# Patient Record
Sex: Female | Born: 2000 | Race: White | Hispanic: Yes | Marital: Single | Smoking: Never smoker
Health system: Southern US, Community
[De-identification: ages and names within clinical notes are randomized; demographics above are authoritative.]

---
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
## 2023-03-26 ENCOUNTER — Ambulatory Visit
Admission: EM | Admit: 2023-03-26 | Discharge: 2023-03-26 | Disposition: A | Discharge status: Home / Self Care | Payer: Medicaid Other | Attending: Nurse Practitioner | Admitting: Nurse Practitioner

## 2023-03-26 ENCOUNTER — Other Ambulatory Visit: Payer: Self-pay

## 2023-03-26 DIAGNOSIS — S90424A Blister (nonthermal), right lesser toe(s), initial encounter: Secondary | ICD-10-CM

## 2023-03-26 DIAGNOSIS — L089 Local infection of the skin and subcutaneous tissue, unspecified: Secondary | ICD-10-CM

## 2023-03-26 MED ORDER — CEPHALEXIN 500 MG PO CAPS: 500.0000 mg | ORAL_CAPSULE | Freq: Three times a day (TID) | ORAL | 0 refills | Status: AC

## 2023-03-26 MED ORDER — MUPIROCIN 2 % EX OINT: 1.0000 | TOPICAL_OINTMENT | Freq: Two times a day (BID) | CUTANEOUS | 0 refills | Status: AC

## 2023-03-26 NOTE — ED Triage Notes (Signed)
Pt c/o right small toe pain, there is a sore on the bottom of the toe, that started out as a blister, x 2 weeks she popped the blister and it would return pt tried using neosporin on it, blister then, started oozing fluid and turned red and caused pain that is localized to the pinky toe.

## 2023-03-26 NOTE — Discharge Instructions (Addendum)
Take medication as prescribed. May take over-the-counter Tylenol or ibuprofen as needed for pain, fever, or general discomfort. Recommend refraining from running for the next 7 days or while symptoms persist. Make sure you are wearing shoes with good insole and support. While you are home leave the area open to air to allow it to heal.  If you have pets in the home, do not allow them to lick or come into close contact with the affected area. If you experience worsening symptoms to include fever, chills, or if the area of the toe becomes open, you have increased swelling in the toe, or other concerns, please go to the emergency department immediately for further evaluation. Follow-up with your primary care physician within the next 2 days for reevaluation. Follow-up as needed.

## 2023-03-26 NOTE — ED Provider Notes (Signed)
[facility]    CSN: [id number] Arrival date & time: 03/26/23  1712      History   Chief Complaint No chief complaint on file.   HPI Kayla Ashley is a 22 y.o. female.   The history is provided by the patient.   Patient presents for complaints of pain to the right fifth toe that is been present for the past 2 weeks.  Patient states the area started as a blister.  She states she "popped" the area, and now she has a wound to that toe.  She states that the area is painful with walking.  She states that the area also began to "ooze fluid".  She denies fever, chills, chest pain, abdominal pain, nausea, vomiting, or diarrhea.  Patient states that symptoms started as she is an avid runner, and runs almost every day.  She states that she did purchase new shoes since her symptoms started.  History reviewed. No pertinent past medical history.  There are no problems to display for this patient.   History reviewed. No pertinent surgical history.  OB History   No obstetric history on file.      Home Medications    Prior to Admission medications   Medication Sig Start Date End Date Taking? Authorizing Provider  cephALEXin (KEFLEX) 500 MG capsule Take 1 capsule (500 mg total) by mouth 3 (three) times daily for 5 days. 03/26/23 03/31/23 Yes Brand Siever-Warren, Sadie Haber, NP  mupirocin ointment (BACTROBAN) 2 % Apply 1 Application topically 2 (two) times daily. 03/26/23  Yes Keyia Moretto-Warren, Sadie Haber, NP  ondansetron (ZOFRAN) 4 MG tablet Take 1 tablet (4 mg total) by mouth every 8 (eight) hours as needed for nausea or vomiting. 09/16/18   Mesner, Barbara Cower, MD    Family History History reviewed. No pertinent family history.  Social History Social History   Tobacco Use   Smoking status: Never   Smokeless tobacco: Never  Substance Use Topics   Alcohol use: No   Drug use: No     Allergies   Peanut-containing drug products   Review of Systems Review of Systems Per  HPI  Physical Exam Triage Vital Signs ED Triage Vitals  Encounter Vitals Group     BP 03/26/23 1807 108/65     Systolic BP Percentile --      Diastolic BP Percentile --      Pulse Rate 03/26/23 1807 82     Resp 03/26/23 1807 15     Temp 03/26/23 1807 98.3 F (36.8 C)     Temp Source 03/26/23 1806 Oral     SpO2 03/26/23 1807 97 %     Weight --      Height --      Head Circumference --      Peak Flow --      Pain Score 03/26/23 1811 8     Pain Loc --      Pain Education --      Exclude from Growth Chart --    No data found.  Updated Vital Signs BP 108/65 (BP Location: Right Arm)   Pulse 82   Temp 98.3 F (36.8 C) (Oral)   Resp 15   LMP 03/12/2023 (Within Days)   SpO2 97%   Visual Acuity Right Eye Distance:   Left Eye Distance:   Bilateral Distance:    Right Eye Near:   Left Eye Near:    Bilateral Near:     Physical Exam Vitals and nursing note reviewed.  Constitutional:      General: She is not in acute distress.    Appearance: Normal appearance.  HENT:     Head: Normocephalic.  Eyes:     Extraocular Movements: Extraocular movements intact.     Pupils: Pupils are equal, round, and reactive to light.  Pulmonary:     Effort: Pulmonary effort is normal.  Skin:    General: Skin is warm and dry.     Findings: Wound present.     Comments: Wound noted to the bottom of the right fifth toe.  Areas of appears as "dried" blood.  Wound is closed.  There is no oozing, fluctuance, or drainage present.  Area is tender to palpation.  There is no swelling, or redness of the right fourth toe.  Neurological:     General: No focal deficit present.     Mental Status: She is alert and oriented to person, place, and time.  Psychiatric:        Mood and Affect: Mood normal.        Behavior: Behavior normal.      UC Treatments / Results  Labs (all labs ordered are listed, but only abnormal results are displayed) Labs Reviewed - No data to display  EKG   Radiology No  results found.  Procedures Procedures (including critical care time)  Medications Ordered in UC Medications - No data to display  Initial Impression / Assessment and Plan / UC Course  I have reviewed the triage vital signs and the nursing notes.  Pertinent labs & imaging results that were available during my care of the patient were reviewed by me and considered in my medical decision making (see chart for details).  The patient is well-appearing, she is in no acute distress, vital signs are stable.  Patient with wound on the fifth toe of the right foot.  Will treat empirically with Keflex 500 mg and mupirocin ointment 2%.  Supportive care recommendations were provided and discussed with the patient to include over-the-counter analgesics, wearing comfortable shoes, and refraining from running for the next several days.  Patient was given strict ER follow-up precautions.  It is recommended that the patient follow-up with her primary care physician for reevaluation patient with a 7 to 10 days before she would return to exercise.  Patient is in agreement with this plan of care and verbalizes understanding.  All questions were answered.  Patient stable for discharge.  Final Clinical Impressions(s) / UC Diagnoses   Final diagnoses:  None     Discharge Instructions      Take medication as prescribed. May take over-the-counter Tylenol or ibuprofen as needed for pain, fever, or general discomfort. Recommend refraining from running for the next 7 days or while symptoms persist. Make sure you are wearing shoes with good insole and support. While you are home leave the area open to air to allow it to heal.  If you have pets in the home, do not allow them to lick or come into close contact with the affected area. If you experience worsening symptoms to include fever, chills, or if the area of the toe becomes open, you have increased swelling in the toe, or other concerns, please go to the  emergency department immediately for further evaluation. Follow-up with your primary care physician within the next 2 days for reevaluation. Follow-up as needed.      ED Prescriptions     Medication Sig Dispense Auth. Provider   cephALEXin (KEFLEX) 500 MG capsule Take  1 capsule (500 mg total) by mouth 3 (three) times daily for 5 days. 15 capsule Muaaz Brau-Warren, Sadie Haber, NP   mupirocin ointment (BACTROBAN) 2 % Apply 1 Application topically 2 (two) times daily. 22 g Lashuna Tamashiro-Warren, Sadie Haber, NP      PDMP not reviewed this encounter.   Abran Cantor, NP 03/27/23 [phone]

## 2024-06-29 ENCOUNTER — Encounter (HOSPITAL_COMMUNITY): Payer: Self-pay

## 2024-06-29 ENCOUNTER — Emergency Department (HOSPITAL_COMMUNITY): Payer: Self-pay

## 2024-06-29 ENCOUNTER — Other Ambulatory Visit: Payer: Self-pay

## 2024-06-29 ENCOUNTER — Emergency Department (HOSPITAL_COMMUNITY)
Admission: EM | Admit: 2024-06-29 | Discharge: 2024-06-30 | Disposition: A | Discharge status: Home / Self Care | Payer: Self-pay | Attending: Emergency Medicine | Admitting: Emergency Medicine

## 2024-06-29 DIAGNOSIS — Y9355 Activity, bike riding: Secondary | ICD-10-CM | POA: Insufficient documentation

## 2024-06-29 DIAGNOSIS — S0081XA Abrasion of other part of head, initial encounter: Secondary | ICD-10-CM | POA: Diagnosis not present

## 2024-06-29 DIAGNOSIS — S81012A Laceration without foreign body, left knee, initial encounter: Secondary | ICD-10-CM | POA: Diagnosis not present

## 2024-06-29 DIAGNOSIS — Z91010 Allergy to peanuts: Secondary | ICD-10-CM | POA: Insufficient documentation

## 2024-06-29 DIAGNOSIS — S52501A Unspecified fracture of the lower end of right radius, initial encounter for closed fracture: Secondary | ICD-10-CM | POA: Diagnosis not present

## 2024-06-29 DIAGNOSIS — S0990XA Unspecified injury of head, initial encounter: Secondary | ICD-10-CM

## 2024-06-29 DIAGNOSIS — M25531 Pain in right wrist: Secondary | ICD-10-CM | POA: Diagnosis present

## 2024-06-29 DIAGNOSIS — S0003XA Contusion of scalp, initial encounter: Secondary | ICD-10-CM | POA: Diagnosis not present

## 2024-06-29 LAB — BASIC METABOLIC PANEL WITH GFR
Anion gap: 13 (ref 5–15)
BUN: 6 mg/dL (ref 6–20)
CO2: 18 mmol/L — ABNORMAL LOW (ref 22–32)
Calcium: 8.6 mg/dL — ABNORMAL LOW (ref 8.9–10.3)
Chloride: 107 mmol/L (ref 98–111)
Creatinine, Ser: 0.49 mg/dL (ref 0.44–1.00)
GFR, Estimated: >60 mL/min
Glucose, Bld: 90 mg/dL (ref 70–99)
Potassium: 3.6 mmol/L (ref 3.5–5.1)
Sodium: 139 mmol/L (ref 135–145)

## 2024-06-29 LAB — HCG, SERUM, QUALITATIVE: Preg, Serum: NEGATIVE

## 2024-06-29 LAB — CBC WITH DIFFERENTIAL/PLATELET
Abs Immature Granulocytes: 0.07 K/uL (ref 0.00–0.07)
Basophils Absolute: 0.1 K/uL (ref 0.0–0.1)
Basophils Relative: 0 %
Eosinophils Absolute: 0 K/uL (ref 0.0–0.5)
Eosinophils Relative: 0 %
HCT: 38.2 % (ref 36.0–46.0)
Hemoglobin: 12.8 g/dL (ref 12.0–15.0)
Immature Granulocytes: 0 %
Lymphocytes Relative: 14 %
Lymphs Abs: 2.3 K/uL (ref 0.7–4.0)
MCH: 29 pg (ref 26.0–34.0)
MCHC: 33.5 g/dL (ref 30.0–36.0)
MCV: 86.4 fL (ref 80.0–100.0)
Monocytes Absolute: 0.8 K/uL (ref 0.1–1.0)
Monocytes Relative: 5 %
Neutro Abs: 12.6 K/uL — ABNORMAL HIGH (ref 1.7–7.7)
Neutrophils Relative %: 81 %
Platelets: 269 K/uL (ref 150–400)
RBC: 4.42 MIL/uL (ref 3.87–5.11)
RDW: 11.9 % (ref 11.5–15.5)
WBC: 15.9 K/uL — ABNORMAL HIGH (ref 4.0–10.5)
nRBC: 0 % (ref 0.0–0.2)

## 2024-06-29 LAB — URINALYSIS, ROUTINE W REFLEX MICROSCOPIC
Bilirubin Urine: NEGATIVE
Glucose, UA: NEGATIVE mg/dL
Hgb urine dipstick: NEGATIVE
Ketones, ur: NEGATIVE mg/dL
Nitrite: NEGATIVE
Protein, ur: NEGATIVE mg/dL
Specific Gravity, Urine: >1.046 — ABNORMAL HIGH (ref 1.005–1.030)
pH: 7 (ref 5.0–8.0)

## 2024-06-29 MED ORDER — IOHEXOL 300 MG/ML  SOLN
100.0000 mL | Freq: Once | INTRAMUSCULAR | Status: AC | PRN
  Administered 2024-06-29: 100 mL via INTRAVENOUS

## 2024-06-29 MED ORDER — HYDROCODONE-ACETAMINOPHEN 5-325 MG PO TABS: 1.0000 | ORAL_TABLET | ORAL | 0 refills | Status: AC | PRN

## 2024-06-29 MED ORDER — ONDANSETRON HCL 4 MG/2ML IJ SOLN
4.0000 mg | Freq: Once | INTRAMUSCULAR | Status: AC
  Administered 2024-06-29: 4 mg via INTRAVENOUS
  Filled 2024-06-29: qty 2

## 2024-06-29 MED ORDER — BACITRACIN ZINC 500 UNIT/GM EX OINT
TOPICAL_OINTMENT | CUTANEOUS | Status: AC
  Administered 2024-06-29: 1
  Filled 2024-06-29: qty 0.9

## 2024-06-29 MED ORDER — HYDROMORPHONE HCL 1 MG/ML IJ SOLN
1.0000 mg | Freq: Once | INTRAMUSCULAR | Status: AC
  Administered 2024-06-29: 1 mg via INTRAVENOUS
  Filled 2024-06-29: qty 1

## 2024-06-29 MED ORDER — HYDROCODONE-ACETAMINOPHEN 5-325 MG PO TABS
1.0000 | ORAL_TABLET | Freq: Once | ORAL | Status: AC
  Administered 2024-06-29: 1 via ORAL
  Filled 2024-06-29: qty 1

## 2024-06-29 MED ORDER — SODIUM CHLORIDE 0.9 % IV BOLUS
1000.0000 mL | Freq: Once | INTRAVENOUS | Status: AC
  Administered 2024-06-29: 1000 mL via INTRAVENOUS

## 2024-06-29 MED ORDER — CYCLOBENZAPRINE HCL 10 MG PO TABS: 10.0000 mg | ORAL_TABLET | Freq: Two times a day (BID) | ORAL | 0 refills | Status: AC | PRN

## 2024-06-29 NOTE — ED Notes (Signed)
 ED Provider at bedside.
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
## 2024-06-29 NOTE — ED Notes (Signed)
 Patient transported to CT
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
## 2024-06-29 NOTE — Discharge Instructions (Signed)
 It was a pleasure caring for you today in the emergency department.  Apply topical antibiotic ointment twice daily to the abrasion on your right cheek.  The glue on your left knee will fall off on its own in the next 2 weeks.  If glue still present after 2 weeks you can apply topical antibiotic ointment to dissolve the remaining glue  Follow-up with orthopedics regarding your wrist fracture.  Based on the events which brought you to the ER today, it is possible that you may have a concussion. A concussion occurs when there is a blow to the head or body, with enough force to shake the brain and disrupt how the brain functions. You may experience symptoms such as headaches, sensitivity to light/noise, dizziness, cognitive slowing, difficulty concentrating / remembering, trouble sleeping and drowsiness. These symptoms may last anywhere from hours/days to potentially weeks/months. While these symptoms are very frustrating and perhaps debilitating, it is important that you remember that they will improve over time. Everyone has a different rate of recovery; it is difficult to predict when your symptoms will resolve. In order to allow for your brain to heal after the injury, we recommend that you see your primary physician or a physician knowledgeable in concussion management. We also advise you to let your body and brain rest: avoid physical activities (sports, gym, and exercise) and reduce cognitive demands (reading, texting, TV watching, computer use, video games, etc). School attendance, after-school activities and work may need to be modified to avoid increasing symptoms. We recommend against driving until until all symptoms have resolvedCome back to the ER right away if you are having repeated episodes of vomiting, severe/worsening headache/dizziness or any other symptom that alarms you. We recommended that someone stay with you for the next 24 hours to monitor for these worrisome symptoms.   Please  return to the emergency department for any worsening or worrisome symptoms.

## 2024-06-30 MED FILL — Hydrocodone-Acetaminophen Tab 5-325 MG: ORAL | Qty: 6 | Status: AC
